# Patient Record
Sex: Male | Born: 1990
Health system: Southern US, Community
[De-identification: ages and names within clinical notes are randomized; demographics above are authoritative.]

## PROBLEM LIST (undated history)

## (undated) DIAGNOSIS — J45909 Unspecified asthma, uncomplicated: Secondary | ICD-10-CM

## (undated) DIAGNOSIS — C801 Malignant (primary) neoplasm, unspecified: Secondary | ICD-10-CM

## (undated) DIAGNOSIS — I10 Essential (primary) hypertension: Secondary | ICD-10-CM

## (undated) HISTORY — PX: KNEE SURGERY: SHX244

## (undated) HISTORY — PX: FINGER SURGERY: SHX640

## (undated) HISTORY — DX: Unspecified asthma, uncomplicated: J45.909

## (undated) HISTORY — DX: Essential (primary) hypertension: I10

## (undated) HISTORY — DX: Malignant (primary) neoplasm, unspecified: C80.1

## (undated) HISTORY — PX: OTHER SURGICAL HISTORY: SHX169

---
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
## 2010-02-27 ENCOUNTER — Other Ambulatory Visit: Payer: Self-pay | Admitting: Pediatrics

## 2012-04-24 ENCOUNTER — Emergency Department: Payer: Self-pay | Admitting: Emergency Medicine
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
## 2014-08-24 ENCOUNTER — Ambulatory Visit: Payer: Self-pay | Admitting: Pediatrics

## 2015-10-27 DIAGNOSIS — R070 Pain in throat: Secondary | ICD-10-CM | POA: Diagnosis not present

## 2016-03-13 IMAGING — CR RIGHT ANKLE - COMPLETE 3+ VIEW
3 series · 3 of 3 positions shown · non-contrast
Comparison: None.

CLINICAL DATA: Right ankle injury 3 weeks ago wall jogging;
symptoms persist especially laterally

EXAM:
RIGHT ANKLE - COMPLETE 3+ VIEW

[ankle ap]
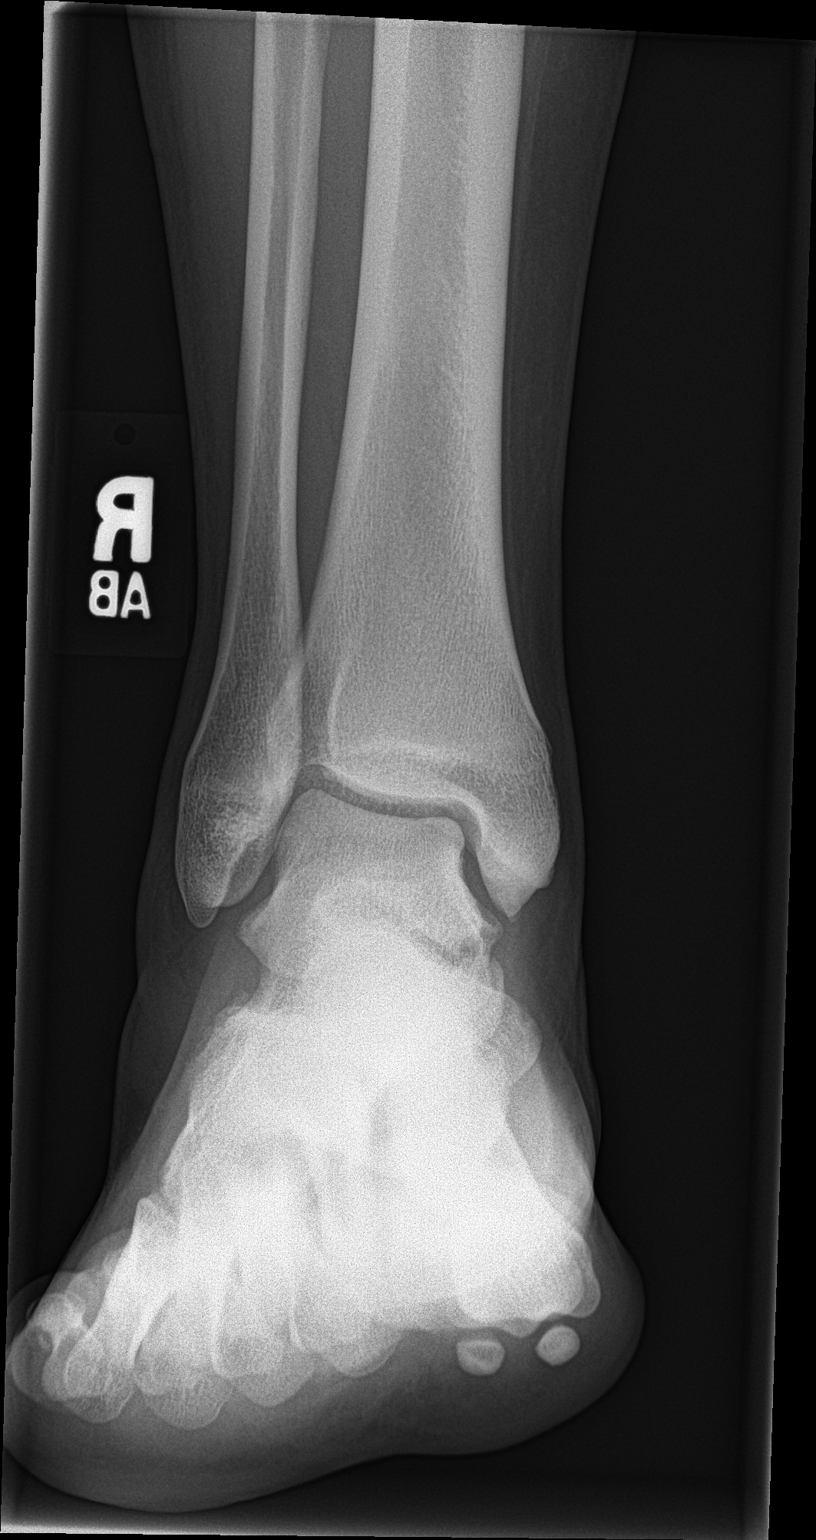

[ankle obl]
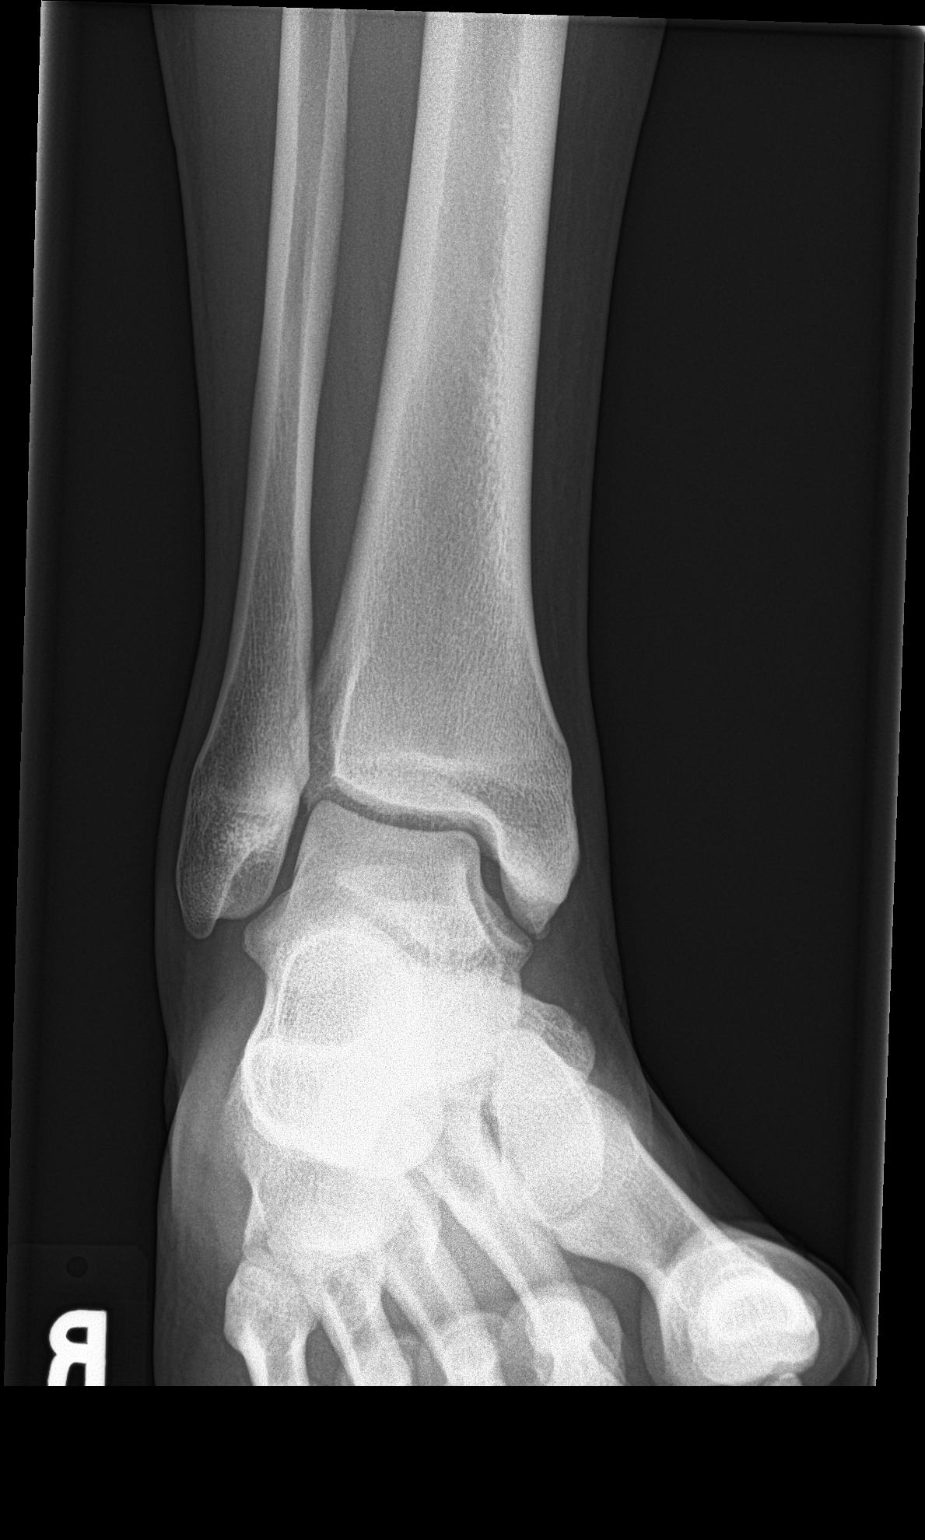

[ankle lat]
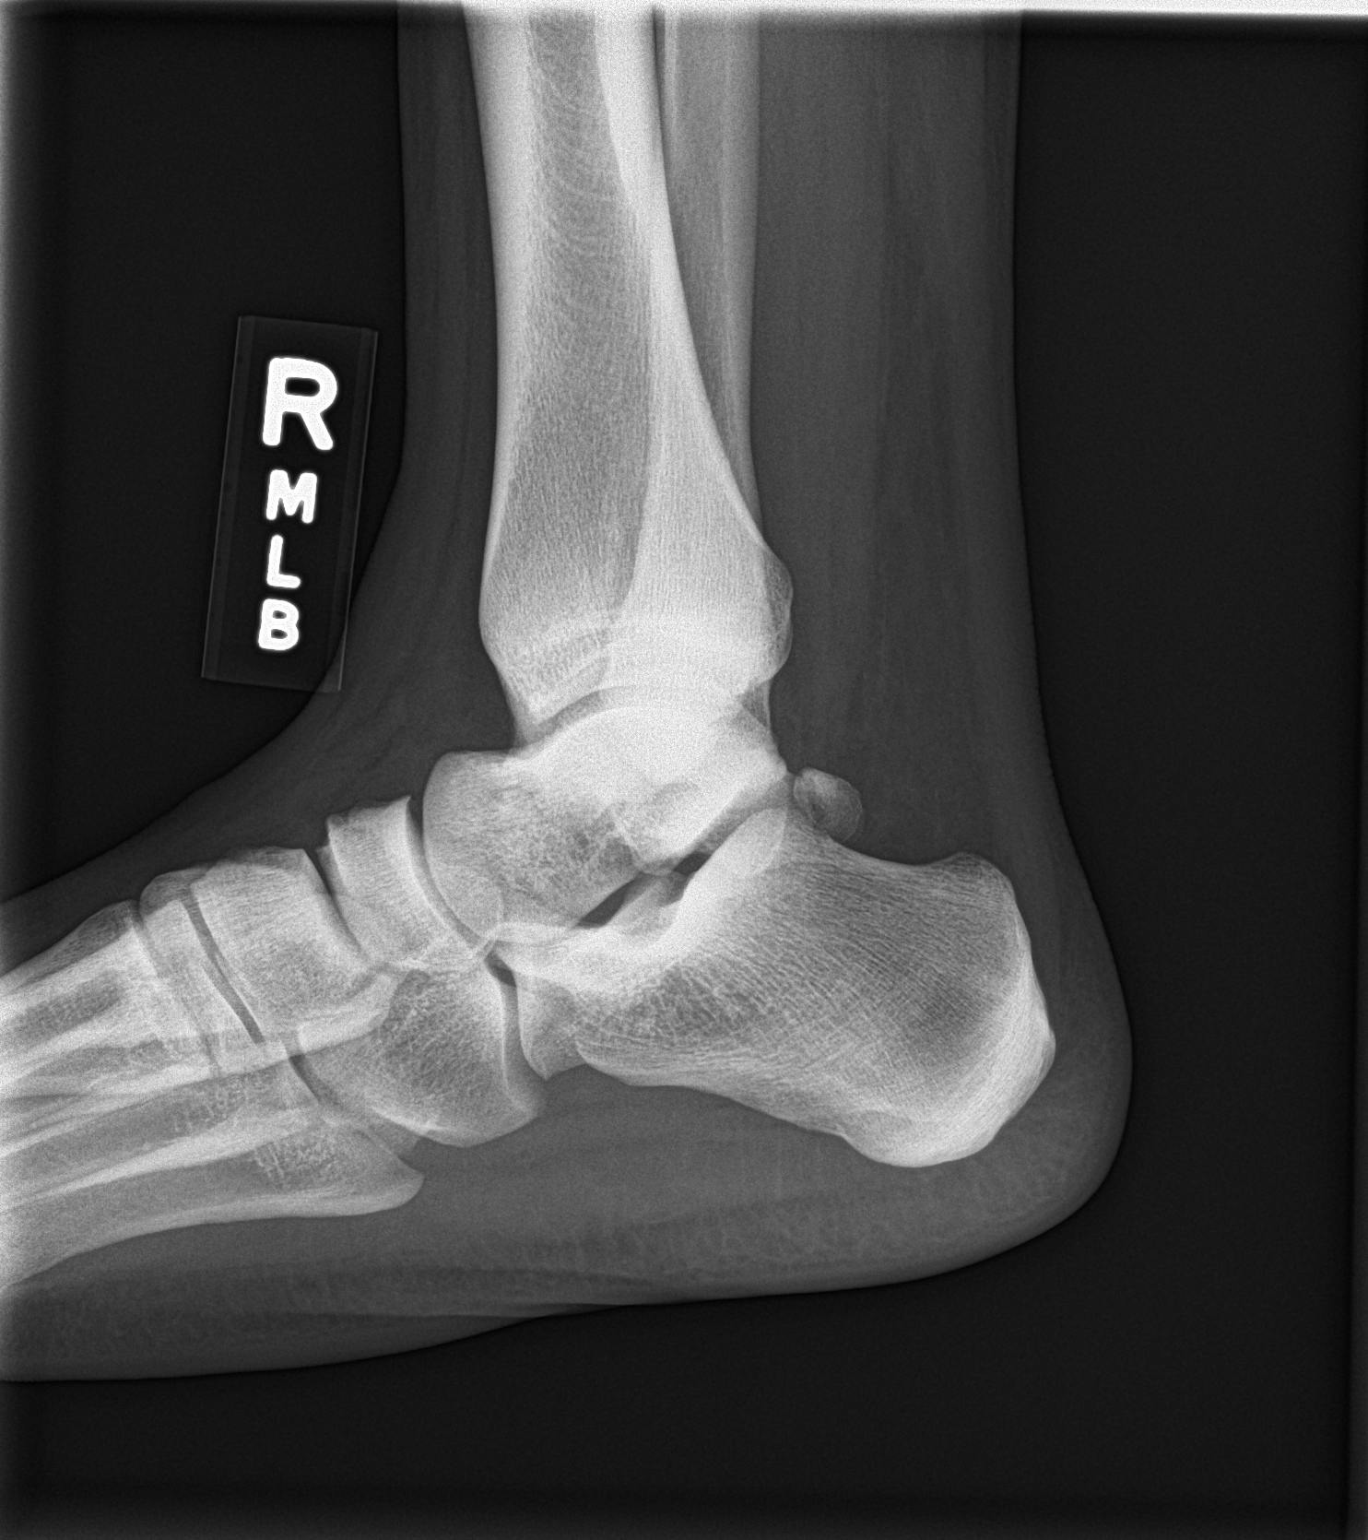

[3 of 3 positions shown; findings below may reference images not displayed]

FINDINGS: The ankle joint mortise is preserved. The talar dome is intact.
There is no acute or healing malleolar fracture. The talus and
calcaneus are intact. The soft tissues are unremarkable.
IMPRESSION: There is no acute or significant chronic bony abnormality of the
right ankle.

## 2016-04-02 ENCOUNTER — Ambulatory Visit (INDEPENDENT_AMBULATORY_CARE_PROVIDER_SITE_OTHER): Payer: 59 | Admitting: Family Medicine

## 2016-04-02 ENCOUNTER — Encounter: Payer: Self-pay | Admitting: Family Medicine

## 2016-04-02 DIAGNOSIS — I10 Essential (primary) hypertension: Secondary | ICD-10-CM

## 2016-04-02 DIAGNOSIS — A6000 Herpesviral infection of urogenital system, unspecified: Secondary | ICD-10-CM

## 2016-04-02 DIAGNOSIS — Z6841 Body Mass Index (BMI) 40.0 and over, adult: Secondary | ICD-10-CM

## 2016-04-02 DIAGNOSIS — Z0001 Encounter for general adult medical examination with abnormal findings: Secondary | ICD-10-CM | POA: Diagnosis not present

## 2016-04-02 DIAGNOSIS — R6889 Other general symptoms and signs: Secondary | ICD-10-CM

## 2016-04-02 LAB — LIPID PANEL
CHOL/HDL RATIO: 6
CHOLESTEROL: 251 mg/dL — AB (ref 0–200)
HDL: 43.4 mg/dL (ref >39.00)
LDL CALC: 186 mg/dL — AB (ref 0–99)
NonHDL: 207.49
TRIGLYCERIDES: 107 mg/dL (ref 0.0–149.0)
VLDL: 21.4 mg/dL (ref 0.0–40.0)

## 2016-04-02 LAB — COMPREHENSIVE METABOLIC PANEL
ALBUMIN: 4.1 g/dL (ref 3.5–5.2)
ALT: 39 U/L (ref 0–53)
AST: 24 U/L (ref 0–37)
Alkaline Phosphatase: 61 U/L (ref 39–117)
BUN: 12 mg/dL (ref 6–23)
CALCIUM: 9.1 mg/dL (ref 8.4–10.5)
CHLORIDE: 107 meq/L (ref 96–112)
CO2: 26 meq/L (ref 19–32)
CREATININE: 0.98 mg/dL (ref 0.40–1.50)
GFR: 120.1 mL/min (ref >60.00)
Glucose, Bld: 88 mg/dL (ref 70–99)
POTASSIUM: 4 meq/L (ref 3.5–5.1)
Sodium: 139 mEq/L (ref 135–145)
Total Bilirubin: 0.5 mg/dL (ref 0.2–1.2)
Total Protein: 6.9 g/dL (ref 6.0–8.3)

## 2016-04-02 LAB — CBC
HEMATOCRIT: 43.3 % (ref 39.0–52.0)
Hemoglobin: 14.6 g/dL (ref 13.0–17.0)
MCHC: 33.6 g/dL (ref 30.0–36.0)
MCV: 82.4 fl (ref 78.0–100.0)
PLATELETS: 205 10*3/uL (ref 150.0–400.0)
RBC: 5.26 Mil/uL (ref 4.22–5.81)
RDW: 14.6 % (ref 11.5–15.5)
WBC: 8.3 10*3/uL (ref 4.0–10.5)

## 2016-04-02 LAB — HEMOGLOBIN A1C: Hgb A1c MFr Bld: 5.8 % (ref 4.6–6.5)

## 2016-04-02 MED ORDER — HYDROCHLOROTHIAZIDE 25 MG PO TABS: 25.0000 mg | ORAL_TABLET | Freq: Every day | ORAL | 3 refills | Status: AC

## 2016-04-02 NOTE — Patient Instructions (Signed)
Take the BP medication daily.  Follow up BP check in 2 weeks.  Take care  Dr. Lacinda Axon   Health Maintenance, Male A healthy lifestyle and preventative care can promote health and wellness.  Maintain regular health, dental, and eye exams.  Eat a healthy diet. Foods like vegetables, fruits, whole grains, low-fat dairy products, and lean protein foods contain the nutrients you need and are low in calories. Decrease your intake of foods high in solid fats, added sugars, and salt. Get information about a proper diet from your health care provider, if necessary.  Regular physical exercise is one of the most important things you can do for your health. Most adults should get at least 150 minutes of moderate-intensity exercise (any activity that increases your heart rate and causes you to sweat) each week. In addition, most adults need muscle-strengthening exercises on 2 or more days a week.   Maintain a healthy weight. The body mass index (BMI) is a screening tool to identify possible weight problems. It provides an estimate of body fat based on height and weight. Your health care provider can find your BMI and can help you achieve or maintain a healthy weight. For males 20 years and older:  A BMI below 18.5 is considered underweight.  A BMI of 18.5 to 24.9 is normal.  A BMI of 25 to 29.9 is considered overweight.  A BMI of 30 and above is considered obese.  Maintain normal blood lipids and cholesterol by exercising and minimizing your intake of saturated fat. Eat a balanced diet with plenty of fruits and vegetables. Blood tests for lipids and cholesterol should begin at age 82 and be repeated every 5 years. If your lipid or cholesterol levels are high, you are over age 41, or you are at high risk for heart disease, you may need your cholesterol levels checked more frequently.Ongoing high lipid and cholesterol levels should be treated with medicines if diet and exercise are not working.  If you  smoke, find out from your health care provider how to quit. If you do not use tobacco, do not start.  Lung cancer screening is recommended for adults aged 60-80 years who are at high risk for developing lung cancer because of a history of smoking. A yearly low-dose CT scan of the lungs is recommended for people who have at least a 30-pack-year history of smoking and are current smokers or have quit within the past 15 years. A pack year of smoking is smoking an average of 1 pack of cigarettes a day for 1 year (for example, a 30-pack-year history of smoking could mean smoking 1 pack a day for 30 years or 2 packs a day for 15 years). Yearly screening should continue until the smoker has stopped smoking for at least 15 years. Yearly screening should be stopped for people who develop a health problem that would prevent them from having lung cancer treatment.  If you choose to drink alcohol, do not have more than 2 drinks per day. One drink is considered to be 12 oz (360 mL) of beer, 5 oz (150 mL) of wine, or 1.5 oz (45 mL) of liquor.  Avoid the use of street drugs. Do not share needles with anyone. Ask for help if you need support or instructions about stopping the use of drugs.  High blood pressure causes heart disease and increases the risk of stroke. High blood pressure is more likely to develop in:  People who have blood pressure in the end  of the normal range (100-139/85-89 mm Hg).  People who are overweight or obese.  People who are African American.  If you are 37-61 years of age, have your blood pressure checked every 3-5 years. If you are 47 years of age or older, have your blood pressure checked every year. You should have your blood pressure measured twice--once when you are at a hospital or clinic, and once when you are not at a hospital or clinic. Record the average of the two measurements. To check your blood pressure when you are not at a hospital or clinic, you can use:  An automated  blood pressure machine at a pharmacy.  A home blood pressure monitor.  If you are 80-19 years old, ask your health care provider if you should take aspirin to prevent heart disease.  Diabetes screening involves taking a blood sample to check your fasting blood sugar level. This should be done once every 3 years after age 24 if you are at a normal weight and without risk factors for diabetes. Testing should be considered at a younger age or be carried out more frequently if you are overweight and have at least 1 risk factor for diabetes.  Colorectal cancer can be detected and often prevented. Most routine colorectal cancer screening begins at the age of 73 and continues through age 18. However, your health care provider may recommend screening at an earlier age if you have risk factors for colon cancer. On a yearly basis, your health care provider may provide home test kits to check for hidden blood in the stool. A small camera at the end of a tube may be used to directly examine the colon (sigmoidoscopy or colonoscopy) to detect the earliest forms of colorectal cancer. Talk to your health care provider about this at age 52 when routine screening begins. A direct exam of the colon should be repeated every 5-10 years through age 49, unless early forms of precancerous polyps or small growths are found.  People who are at an increased risk for hepatitis B should be screened for this virus. You are considered at high risk for hepatitis B if:  You were born in a country where hepatitis B occurs often. Talk with your health care provider about which countries are considered high risk.  Your parents were born in a high-risk country and you have not received a shot to protect against hepatitis B (hepatitis B vaccine).  You have HIV or AIDS.  You use needles to inject street drugs.  You live with, or have sex with, someone who has hepatitis B.  You are a man who has sex with other men (MSM).  You get  hemodialysis treatment.  You take certain medicines for conditions like cancer, organ transplantation, and autoimmune conditions.  Hepatitis C blood testing is recommended for all people born from [birth year] through 1965 and any individual with known risk factors for hepatitis C.  Healthy men should no longer receive prostate-specific antigen (PSA) blood tests as part of routine cancer screening. Talk to your health care provider about prostate cancer screening.  Testicular cancer screening is not recommended for adolescents or adult males who have no symptoms. Screening includes self-exam, a health care provider exam, and other screening tests. Consult with your health care provider about any symptoms you have or any concerns you have about testicular cancer.  Practice safe sex. Use condoms and avoid high-risk sexual practices to reduce the spread of sexually transmitted infections (STIs).  You should be  screened for STIs, including gonorrhea and chlamydia if:  You are sexually active and are younger than 24 years.  You are older than 24 years, and your health care provider tells you that you are at risk for this type of infection.  Your sexual activity has changed since you were last screened, and you are at an increased risk for chlamydia or gonorrhea. Ask your health care provider if you are at risk.  If you are at risk of being infected with HIV, it is recommended that you take a prescription medicine daily to prevent HIV infection. This is called pre-exposure prophylaxis (PrEP). You are considered at risk if:  You are a man who has sex with other men (MSM).  You are a heterosexual man who is sexually active with multiple partners.  You take drugs by injection.  You are sexually active with a partner who has HIV.  Talk with your health care provider about whether you are at high risk of being infected with HIV. If you choose to begin PrEP, you should first be tested for HIV. You should  then be tested every 3 months for as long as you are taking PrEP.  Use sunscreen. Apply sunscreen liberally and repeatedly throughout the day. You should seek shade when your shadow is shorter than you. Protect yourself by wearing long sleeves, pants, a wide-brimmed hat, and sunglasses year round whenever you are outdoors.  Tell your health care provider of new moles or changes in moles, especially if there is a change in shape or color. Also, tell your health care provider if a mole is larger than the size of a pencil eraser.  A one-time screening for abdominal aortic aneurysm (AAA) and surgical repair of large AAAs by ultrasound is recommended for men aged 70-75 years who are current or former smokers.  Stay current with your vaccines (immunizations).   This information is not intended to replace advice given to you by your health care provider. Make sure you discuss any questions you have with your health care provider.   Document Released: 01/26/2008 Document Revised: 08/20/2014 Document Reviewed: 12/25/2010 Elsevier Interactive Patient Education [employer].

## 2016-04-02 NOTE — Progress Notes (Signed)
Pre visit review using our clinic review tool, if applicable. No additional management support is needed unless otherwise documented below in the visit note. 

## 2016-04-03 ENCOUNTER — Encounter: Payer: Self-pay | Admitting: Family Medicine

## 2016-04-03 DIAGNOSIS — A6000 Herpesviral infection of urogenital system, unspecified: Secondary | ICD-10-CM | POA: Insufficient documentation

## 2016-04-03 DIAGNOSIS — Z0001 Encounter for general adult medical examination with abnormal findings: Secondary | ICD-10-CM | POA: Insufficient documentation

## 2016-04-03 DIAGNOSIS — I10 Essential (primary) hypertension: Secondary | ICD-10-CM | POA: Insufficient documentation

## 2016-04-03 MED ORDER — VALACYCLOVIR HCL 500 MG PO TABS: 500.0000 mg | ORAL_TABLET | Freq: Two times a day (BID) | ORAL | 0 refills | Status: AC

## 2016-04-03 NOTE — Assessment & Plan Note (Signed)
Unsure about tetanus. Discussed diet and exercise for weight loss. Screening labs today.

## 2016-04-03 NOTE — Assessment & Plan Note (Signed)
Stable.  No recent outbreaks. Valtrex given to be used at first sign of outbreak.

## 2016-04-03 NOTE — Progress Notes (Signed)
 Subjective:  Patient ID: Edgar Oliver, male    DOB: 09/01/1990  Age: 25 y.o. MRN: [id number]  CC: Establish care  HPI Edgar Oliver is a 25 y.o. male presents to the clinic today to establish care. Additional issues are below.  Preventative Healthcare  Immunizations  Tetanus - Unsure.   Flu - We have not started administering flu vaccines yet.  Labs: Screening labs today.  Exercise: Reports regular exercise.   Alcohol use: See below.   Smoking/tobacco use: Nonsmoker.  STD/HIV testing: No currently sexually active.  Wears seat belt: Yes.   Elevated BP  Noted today.  Patient states that his BP is always elevated and has been for years.  He has never been on medication for this.   Need to discuss this today.  HSV  Patient has a history of genital herpes.  No recent outbreaks.  He is requesting refill on Valtrex (to be used with outbreaks).  PMH, Surgical Hx, Family Hx, Social History reviewed and updated as below.  Past Medical History:  Diagnosis Date  . Asthma   . Cancer (HCC)    Liposarcoma (popliteal region, L)  . Hypertension    Past Surgical History:  Procedure Laterality Date  . FINGER SURGERY    . KNEE SURGERY    . Resection of L leg sarcoma     Family History  Problem Relation Age of Onset  . Hyperlipidemia Mother   . Hypertension Mother   . Diabetes Sister    Social History  Substance Use Topics  . Smoking status: Never Smoker  . Smokeless tobacco: Never Used  . Alcohol use Yes     Comment: 1-2   Review of Systems General: Denies unexplained weight loss, fever. Skin: Denies new or changing mole, sore/wound that won't heal. ENT: Trouble hearing, ringing in the ears, sores in the mouth, hoarseness, trouble swallowing. Eyes: Denies trouble seeing/visual disturbance. Heart/CV: Denies chest pain, shortness of breath, edema, palpitations. Lungs/Resp: Denies cough, shortness of breath, hemoptysis. Abd/GI: Denies  nausea, vomiting, diarrhea, constipation, abdominal pain, hematochezia, melena. GU: Denies dysuria, incontinence, hematuria, urinary frequency, difficulty starting/keeping stream, penile discharge, sexual difficulty, lump in testicles. MSK: Denies joint pain/swelling, myalgias. Neuro: Denies headaches, weakness, numbness, dizziness, syncope. Psych: Denies sadness, anxiety, stress, memory difficulty. Endocrine: Denies polyuria and polydipsia.  Objective:   Today's Vitals: BP (!) 147/99 (BP Location: Left Arm, Patient Position: Sitting, Cuff Size: Large)   Pulse 82   Temp 97.1 F (36.2 C) (Oral)   Ht 5' 10.9" (1.801 m)   Wt (!) 321 lb (145.6 kg)   SpO2 97%   BMI 44.90 kg/m   Physical Exam  Constitutional: He is oriented to person, place, and time. He appears well-developed. No distress.  Morbidly obese.   HENT:  Head: Normocephalic and atraumatic.  Mouth/Throat: Oropharynx is clear and moist.  Normal TM's bilaterally.  Eyes: Conjunctivae are normal.  Neck: Neck supple.  Cardiovascular: Normal rate and regular rhythm.   Pulmonary/Chest: Effort normal. He has no wheezes. He has no rales.  Abdominal: Soft. He exhibits no distension. There is no tenderness. There is no rebound and no guarding.  Musculoskeletal: Normal range of motion.  Neurological: He is alert and oriented to person, place, and time.  Skin: Skin is warm and dry.  Psychiatric: He has a normal mood and affect.  Vitals reviewed.  Assessment & Plan:   Problem List Items Addressed This Visit    HTN (hypertension)    New problem, new diagnosis.   No prior treatment (unsure why). Labs today. Starting HCTZ.      Relevant Medications   hydrochlorothiazide (HYDRODIURIL) 25 MG tablet   Genital herpes    Stable.  No recent outbreaks. Valtrex given to be used at first sign of outbreak.      Relevant Medications   valACYclovir (VALTREX) 500 MG tablet   Encounter for preventative adult health care exam with  abnormal findings    Unsure about tetanus. Discussed diet and exercise for weight loss. Screening labs today.        Other Visit Diagnoses    Morbid obesity with BMI of 40.0-44.9, adult (HCC)       Relevant Orders   CBC (Completed)   Comp Met (CMET) (Completed)   Lipid Profile (Completed)   HgB A1c (Completed)      Outpatient Encounter Prescriptions as of 04/03/2019  Medication Sig  . hydrochlorothiazide (HYDRODIURIL) 25 MG tablet Take 1 tablet (25 mg total) by mouth daily.  . valACYclovir (VALTREX) 500 MG tablet Take 1 tablet (500 mg total) by mouth 2 (two) times daily. At first sign of outbreak for 3 days.   No facility-administered encounter medications on file as of 04/02/2016.     Follow-up: Return in about 2 weeks (around 04/16/2016) for BP check - Nurse visit.  Jayce Cook DO [facility] [place]      

## 2016-04-03 NOTE — Assessment & Plan Note (Signed)
New problem, new diagnosis. No prior treatment (unsure why). Labs today. Starting HCTZ.

## 2016-05-02 ENCOUNTER — Ambulatory Visit (INDEPENDENT_AMBULATORY_CARE_PROVIDER_SITE_OTHER): Payer: 59

## 2016-05-02 VITALS — BP 122/80 | HR 65 | Resp 18 | Wt 313.5 lb

## 2016-05-02 DIAGNOSIS — I10 Essential (primary) hypertension: Secondary | ICD-10-CM | POA: Diagnosis not present

## 2016-05-02 NOTE — Progress Notes (Signed)
BP at goal. Continue med.

## 2016-05-02 NOTE — Progress Notes (Signed)
Patient came in for BP check, see vitals for details.  Patient has been taking BP meds with no difficulties.  BP check ordered in OV on 04/02/16.  Patient was started on HCTZ.    Please advise in Dr. Jonathon Jordan absence, thanks

## 2016-05-06 NOTE — Progress Notes (Signed)
I have reviewed an agree with the documentation. BP at goal. Proceed per Dr Lacinda Axon.   Tommi Rumps, MD

## 2016-07-12 ENCOUNTER — Ambulatory Visit: Payer: 59

## 2016-07-17 ENCOUNTER — Ambulatory Visit (INDEPENDENT_AMBULATORY_CARE_PROVIDER_SITE_OTHER): Payer: 59

## 2016-07-17 DIAGNOSIS — Z111 Encounter for screening for respiratory tuberculosis: Secondary | ICD-10-CM | POA: Diagnosis not present

## 2016-07-17 NOTE — Progress Notes (Signed)
Patient comes in for PPD placement .  Placed on left forearm.  Patient instructed to return in 48 hours to 72 hours for reading.

## 2016-07-19 ENCOUNTER — Ambulatory Visit (INDEPENDENT_AMBULATORY_CARE_PROVIDER_SITE_OTHER): Payer: 59

## 2016-07-19 DIAGNOSIS — Z111 Encounter for screening for respiratory tuberculosis: Secondary | ICD-10-CM | POA: Diagnosis not present

## 2016-07-19 LAB — TB SKIN TEST: TB SKIN TEST: NEGATIVE

## 2016-07-19 NOTE — Progress Notes (Signed)
Patient comes in for PPD reading.   See result notes .

## 2016-08-03 NOTE — Progress Notes (Signed)
Care was provided under my supervision. I agree with the management as indicated in the note.  Jaxzen Vanhorn DO  

## 2016-12-03 ENCOUNTER — Encounter: Payer: Self-pay | Admitting: Family Medicine

## 2016-12-03 ENCOUNTER — Ambulatory Visit (INDEPENDENT_AMBULATORY_CARE_PROVIDER_SITE_OTHER): Payer: 59 | Admitting: Family Medicine

## 2016-12-03 DIAGNOSIS — H1010 Acute atopic conjunctivitis, unspecified eye: Secondary | ICD-10-CM | POA: Insufficient documentation

## 2016-12-03 DIAGNOSIS — H1013 Acute atopic conjunctivitis, bilateral: Secondary | ICD-10-CM

## 2016-12-03 MED ORDER — OLOPATADINE HCL 0.2 % OP SOLN: OPHTHALMIC | 0 refills | Status: AC

## 2016-12-03 MED ORDER — METHYLPREDNISOLONE ACETATE 80 MG/ML IJ SUSP
80.0000 mg | Freq: Once | INTRAMUSCULAR | Status: AC
  Administered 2016-12-03: 80 mg via INTRAMUSCULAR

## 2016-12-03 NOTE — Assessment & Plan Note (Signed)
New problem. With associated lymphedema and perioral swelling. Treating with prednisone and pataday. Continue OTC antihistamine.

## 2016-12-03 NOTE — Patient Instructions (Signed)
Use the medication as prescribed.  If you're not better by tomorrow, call [facility].  Take care  Dr. Lacinda Axon
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
## 2016-12-03 NOTE — Progress Notes (Signed)
Pre visit review using our clinic review tool, if applicable. No additional management support is needed unless otherwise documented below in the visit note. 

## 2021-06-19 ENCOUNTER — Other Ambulatory Visit: Payer: Self-pay

## 2021-06-19 ENCOUNTER — Ambulatory Visit
Admission: EM | Admit: 2021-06-19 | Discharge: 2021-06-19 | Disposition: A | Discharge status: Home / Self Care | Payer: Managed Care, Other (non HMO) | Attending: Emergency Medicine | Admitting: Emergency Medicine

## 2021-06-19 ENCOUNTER — Encounter: Payer: Self-pay | Admitting: Emergency Medicine

## 2021-06-19 DIAGNOSIS — R0981 Nasal congestion: Secondary | ICD-10-CM

## 2021-06-19 DIAGNOSIS — J309 Allergic rhinitis, unspecified: Secondary | ICD-10-CM

## 2021-06-19 MED ORDER — PREDNISONE 10 MG PO TABS: ORAL_TABLET | ORAL | 0 refills | Status: AC

## 2021-06-19 MED ORDER — AZELASTINE HCL 0.1 % NA SOLN: 2.0000 | Freq: Two times a day (BID) | NASAL | 0 refills | Status: AC

## 2021-06-19 NOTE — ED Triage Notes (Signed)
Pt here with cough, congestion, sore throat x 1 week. Had COVID just over 2 weeks ago.

## 2021-06-19 NOTE — Discharge Instructions (Addendum)
Use Astelin and prednisone as directed.  Take over-the-counter antihistamine as directed on the box.  Return with any shortness of breath, chest pain, uncontrolled fever.

## 2021-06-19 NOTE — ED Provider Notes (Addendum)
CHIEF COMPLAINT:   Chief Complaint  Patient presents with   Nasal Congestion   Sore Throat     SUBJECTIVE/HPI:   Sore Throat  A very pleasant 30 y.o.Male presents today with cough, congestion, sore throat for the last 1 week.  Patient reports having had COVID-19 about 2 weeks ago.  No improvement with nasal spray.  Patient flew here from [place].  Patient does not report any shortness of breath, chest pain, palpitations, visual changes, weakness, tingling, headache, nausea, vomiting, diarrhea, fever, chills.   has a past medical history of Asthma, Cancer ([place]), and Hypertension.  ROS:  Review of Systems See Subjective/HPI Medications, Allergies and Problem List personally reviewed in Epic today OBJECTIVE:   Vitals:   06/19/21 0826  BP: (!) 164/93  Pulse: 89  Resp: 20  Temp: 99 F (37.2 C)  SpO2: 98%    Physical Exam   General: Appears well-developed and well-nourished. No acute distress.  HEENT Head: Normocephalic and atraumatic.  Allergic shiners noted bilaterally. Ears: Hearing grossly intact, no drainage or visible deformity.  Nose: No nasal deviation.  Pale boggy nasal mucosa noted bilaterally. Mouth/Throat: No stridor or tracheal deviation.  Non erythematous posterior pharynx noted with clear drainage present.  No white patchy exudate noted. Eyes: Conjunctivae and EOM are normal. No eye drainage or scleral icterus bilaterally.  Neck: Normal range of motion, neck is supple.  Cardiovascular: Normal rate. Regular rhythm; no murmurs, gallops, or rubs.  Pulm/Chest: No respiratory distress. Breath sounds normal bilaterally without wheezes, rhonchi, or rales.  Neurological: Alert and oriented to person, place, and time.  Skin: Skin is warm and dry.  No rashes, lesions, abrasions or bruising noted to skin.   Psychiatric: Normal mood, affect, behavior, and thought content.   Vital signs and nursing note reviewed.   Patient stable and cooperative with  examination. PROCEDURES:    LABS/X-RAYS/EKG/MEDS:   No results found for any visits on 06/19/21.  MEDICAL DECISION MAKING:   Patient presents with cough, congestion, sore throat for the last 1 week.  Patient reports having had COVID-19 about 2 weeks ago. Patient does not report any shortness of breath, chest pain, palpitations, visual changes, weakness, tingling, headache, nausea, vomiting, diarrhea, fever, chills.  Given symptoms along with assessment findings, likely allergic rhinitis.  Rx'd Astelin nasal spray and a low dose prednisone taper to the patient's preferred pharmacy and advised about home treatment and care as outlined in his AVS to include over-the-counter antihistamines.  Return with any shortness of breath, chest pain, uncontrolled fever.  Patient verbalized understanding and agreed with treatment plan.  Patient stable upon discharge. ASSESSMENT/PLAN:  1. Allergic rhinitis, unspecified seasonality, unspecified trigger - azelastine (ASTELIN) 0.1 % nasal spray; Place 2 sprays into both nostrils 2 (two) times daily for 7 days. Use in each nostril as directed  Dispense: 30 mL; Refill: 0  2. Nasal congestion - predniSONE (DELTASONE) 10 MG tablet; Take 3 tablets (30 mg total) by mouth daily with breakfast for 2 days, THEN 2 tablets (20 mg total) daily with breakfast for 2 days, THEN 1 tablet (10 mg total) daily with breakfast for 2 days.  Dispense: 12 tablet; Refill: 0 Instructions about new medications and side effects provided.  Plan:   Discharge Instructions      Use Astelin and prednisone as directed.  Take over-the-counter antihistamine as directed on the box.  Return with any shortness of breath, chest pain, uncontrolled fever.         Serafina Royals, [place] 06/19/21  0658    Serafina Royals, [place] 06/19/21 [place]    Serafina Royals, [place] 06/19/21 [phone]

## 2022-05-01 ENCOUNTER — Ambulatory Visit
Admission: EM | Admit: 2022-05-01 | Discharge: 2022-05-01 | Disposition: A | Discharge status: Home / Self Care | Payer: Managed Care, Other (non HMO) | Attending: Emergency Medicine | Admitting: Emergency Medicine

## 2022-05-01 DIAGNOSIS — I10 Essential (primary) hypertension: Secondary | ICD-10-CM

## 2022-05-01 DIAGNOSIS — J029 Acute pharyngitis, unspecified: Secondary | ICD-10-CM

## 2022-05-01 LAB — POCT RAPID STREP A (OFFICE): Rapid Strep A Screen: NEGATIVE

## 2022-05-01 NOTE — ED Triage Notes (Signed)
Patient to Urgent Care with complaints of a sore throat since sunday. Denies fevers or other symptoms.

## 2022-05-01 NOTE — Discharge Instructions (Addendum)
Your strep test is negative.  Take Tylenol or ibuprofen as needed for fever or discomfort.  Follow up with a primary care provider if your symptoms are not improving.    Your blood pressure is elevated today at 163/116; recheck 164/118; recheck 152/104.  Please have this rechecked by a primary care provider in 1-2 weeks.

## 2022-05-01 NOTE — ED Provider Notes (Signed)
[facility]    CSN: [id number] Arrival date & time: 05/01/22  1205      History   Chief Complaint Chief Complaint  Patient presents with   Sore Throat    HPI Edgar Oliver is a 31 y.o. male.  Patient presents with 2-day history of sore throat.  No fever, chills, rash, cough, shortness of breath, vomiting, diarrhea, or other symptoms.  No treatments at home.  His medical history includes hypertension, liposarcoma, asthma.  He does not currently have a PCP and does not want assistance in establishing one at this time.  He is not taking blood pressure medication.  The history is provided by the patient and medical records.    Past Medical History:  Diagnosis Date   Asthma    Cancer ([place])    Liposarcoma (popliteal region, L)   Hypertension     Patient Active Problem List   Diagnosis Date Noted   Allergic conjunctivitis 12/03/2016   Encounter for preventative adult health care exam with abnormal findings 04/03/2016   Genital herpes 04/03/2016   HTN (hypertension) 04/03/2016    Past Surgical History:  Procedure Laterality Date   FINGER SURGERY     KNEE SURGERY     Resection of L leg sarcoma         Home Medications    Prior to Admission medications   Medication Sig Start Date End Date Taking? Authorizing Provider  azelastine (ASTELIN) 0.1 % nasal spray Place 2 sprays into both nostrils 2 (two) times daily for 7 days. Use in each nostril as directed 06/19/21 06/26/21  Boddu, Erasmo Downer, FNP  hydrochlorothiazide (HYDRODIURIL) 25 MG tablet Take 1 tablet (25 mg total) by mouth daily. 04/02/16   Thersa Salt G, DO  Olopatadine HCl 0.2 % SOLN 1 drop to both eyes daily. 12/03/16   Coral Spikes, DO  valACYclovir (VALTREX) 500 MG tablet Take 1 tablet (500 mg total) by mouth 2 (two) times daily. At first sign of outbreak for 3 days. 04/03/16   Coral Spikes, DO    Family History Family History  Problem Relation Age of Onset   Hyperlipidemia Mother     Hypertension Mother    Diabetes Sister     Social History Social History   Tobacco Use   Smoking status: Never   Smokeless tobacco: Never  Substance Use Topics   Alcohol use: Yes    Comment: 1-2   Drug use: No     Allergies   Patient has no known allergies.   Review of Systems Review of Systems  Constitutional:  Negative for chills and fever.  HENT:  Positive for sore throat. Negative for ear pain.   Respiratory:  Negative for cough and shortness of breath.   Cardiovascular:  Negative for chest pain and palpitations.  Gastrointestinal:  Negative for diarrhea and vomiting.  Skin:  Negative for color change and rash.  All other systems reviewed and are negative.    Physical Exam Triage Vital Signs ED Triage Vitals  Enc Vitals Group     BP      Pulse      Resp      Temp      Temp src      SpO2      Weight      Height      Head Circumference      Peak Flow      Pain Score      Pain Loc  Pain Edu?      Excl. in [place]?    No data found.  Updated Vital Signs BP (!) 152/104   Pulse 88   Temp 98.1 F (36.7 C)   Resp 18   Ht '5\' 10"'$  (1.778 m)   Wt (!) 317 lb (143.8 kg)   SpO2 97%   BMI 45.48 kg/m   Visual Acuity Right Eye Distance:   Left Eye Distance:   Bilateral Distance:    Right Eye Near:   Left Eye Near:    Bilateral Near:     Physical Exam Vitals and nursing note reviewed.  Constitutional:      General: He is not in acute distress.    Appearance: He is well-developed. He is obese. He is not ill-appearing.  HENT:     Right Ear: Tympanic membrane normal.     Left Ear: Tympanic membrane normal.     Nose: Nose normal.     Mouth/Throat:     Mouth: Mucous membranes are moist.     Pharynx: Posterior oropharyngeal erythema present.  Cardiovascular:     Rate and Rhythm: Normal rate and regular rhythm.     Heart sounds: Normal heart sounds.  Pulmonary:     Effort: Pulmonary effort is normal. No respiratory distress.     Breath sounds:  Normal breath sounds.  Musculoskeletal:     Cervical back: Neck supple.  Skin:    General: Skin is warm and dry.  Neurological:     Mental Status: He is alert.  Psychiatric:        Mood and Affect: Mood normal.        Behavior: Behavior normal.      UC Treatments / Results  Labs (all labs ordered are listed, but only abnormal results are displayed) Labs Reviewed  POCT RAPID STREP A (OFFICE)    EKG   Radiology No results found.  Procedures Procedures (including critical care time)  Medications Ordered in UC Medications - No data to display  Initial Impression / Assessment and Plan / UC Course  I have reviewed the triage vital signs and the nursing notes.  Pertinent labs & imaging results that were available during my care of the patient were reviewed by me and considered in my medical decision making (see chart for details).   Viral pharyngitis.  Rapid strep negative.  Discussed symptomatic treatment including Tylenol or ibuprofen as needed.  Also discussed that his blood pressure is elevated today and needs to be rechecked by a PCP in 1 to 2 weeks.  He does not currently have a PCP and declines assistance in establishing a PCP at this time.  Education provided on managing hypertension.  Instructed patient to establish a PCP soon as possible.  He agrees to plan of care.  Final Clinical Impressions(s) / UC Diagnoses   Final diagnoses:  Viral pharyngitis  Elevated blood pressure reading in office with diagnosis of hypertension     Discharge Instructions      Your strep test is negative.  Take Tylenol or ibuprofen as needed for fever or discomfort.  Follow up with a primary care provider if your symptoms are not improving.    Your blood pressure is elevated today at 163/116; recheck 164/118; recheck 152/104.  Please have this rechecked by a primary care provider in 1-2 weeks.          ED Prescriptions   None    PDMP not reviewed this encounter.   Sharion Balloon,  NP 05/01/22 1307
